# Patient Record
Sex: Female | Born: 2000 | Race: Black or African American | Hispanic: No | Marital: Single | Smoking: Never smoker
Health system: Southern US, Community
[De-identification: ages and names within clinical notes are randomized; demographics above are authoritative.]

## PROBLEM LIST (undated history)

## (undated) DIAGNOSIS — Z9109 Other allergy status, other than to drugs and biological substances: Secondary | ICD-10-CM

## (undated) DIAGNOSIS — J45909 Unspecified asthma, uncomplicated: Secondary | ICD-10-CM

---
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
## 2019-09-11 ENCOUNTER — Encounter: Payer: Self-pay | Admitting: Family Medicine

## 2019-09-11 ENCOUNTER — Other Ambulatory Visit: Payer: Self-pay

## 2019-09-11 ENCOUNTER — Encounter: Payer: Self-pay | Admitting: Obstetrics and Gynecology

## 2019-09-11 ENCOUNTER — Ambulatory Visit (INDEPENDENT_AMBULATORY_CARE_PROVIDER_SITE_OTHER): Payer: Medicaid - Out of State | Admitting: Obstetrics and Gynecology

## 2019-09-11 DIAGNOSIS — Z1389 Encounter for screening for other disorder: Secondary | ICD-10-CM | POA: Diagnosis not present

## 2019-09-11 NOTE — Progress Notes (Signed)
    Post Partum Visit Note  Kristen Irwin is a 19 y.o. No obstetric history on file. female who presents for a postpartum visit. She is 3 weeks postpartum following a Primary ceseran.  I have fully reviewed the prenatal and intrapartum course. The delivery was at 37.0 gestational weeks.  Anesthesia: epidural and spinal. Postpartum course has been unremarkable. Baby is doing well. Baby is feeding by both breast and bottle - Enfamil with Iron. Bleeding no bleeding. Bowel function is normal. Bladder function is normal. Patient is not sexually active. Contraception method is Depo-Provera. Postpartum depression screening: Negative. Patient delivered in [facility]. No records. Baby was breech presentation. Desires to establish care with of office.   The following portions of the patient's history were reviewed and updated as appropriate: allergies, current medications, past family history, past medical history, past social history, past surgical history and problem list.  Review of Systems Pertinent items are noted in HPI.    Objective:  Blood pressure 124/80, pulse 94, weight 92 lb 12.8 oz (42.1 kg).  General:  alert, cooperative and appears stated age  Lungs: clear to auscultation bilaterally  Heart:  regular rate and rhythm, S1, S2 normal, no murmur, click, rub or gallop  Abdomen: soft, non-tender; bowel sounds normal; no masses,  no organomegaly        Assessment:   Normal postpartum exam. Pap smear @ age 21 Depo given in hospital.   Plan:   Essential components of care per ACOG recommendations:  1.  Mood and well being: Patient with negative depression screening today. Reviewed local resources for support.  - Patient does not use tobacco. If using tobacco we discussed reduction and for recently cessation risk of relapse - hx of drug use? No    2. Infant care and feeding:  -Patient currently breastmilk feeding? Yes If breastmilk feeding discussed return to work and pumping. If needed, patient  was provided letter for work to allow for every 2-3 hr pumping breaks, and to be granted a private location to express breastmilk and refrigerated area to store breastmilk. Reviewed importance of draining breast regularly to support lactation. -Social determinants of health (SDOH) reviewed in EPIC. No concerns  3. Sexuality, contraception and birth spacing - Patient does not want a pregnancy in the next year.  Desired family size is 1 children.  - Reviewed forms of contraception in tiered fashion. Patient desired Depo-Provera today.   - Discussed birth spacing of 18 months  4. Sleep and fatigue -Encouraged family/partner/community support of 4 hrs of uninterrupted sleep to help with mood and fatigue  5. Physical Recovery  - Discussed patients delivery and complications - Patient has urinary incontinence? No - Patient is safe to resume physical and sexual activity  6.  Health Maintenance - Last pap smear done NA    7. Chronic Disease- NONE  - PCP follow up    Venia Carbon, NP [facility] for [employer], [facility]

## 2019-11-05 ENCOUNTER — Ambulatory Visit (INDEPENDENT_AMBULATORY_CARE_PROVIDER_SITE_OTHER): Payer: Medicaid Other

## 2019-11-05 ENCOUNTER — Other Ambulatory Visit: Payer: Self-pay

## 2019-11-05 VITALS — BP 102/70 | HR 89 | Ht 61.0 in | Wt 90.0 lb

## 2019-11-05 DIAGNOSIS — Z3042 Encounter for surveillance of injectable contraceptive: Secondary | ICD-10-CM

## 2019-11-05 MED ORDER — MEDROXYPROGESTERONE ACETATE 150 MG/ML IM SUSP
150.0000 mg | Freq: Once | INTRAMUSCULAR | Status: AC
  Administered 2019-11-05: 15:00:00 150 mg via INTRAMUSCULAR

## 2019-11-05 NOTE — Progress Notes (Signed)
Patient was assessed and managed by nursing staff during this encounter. I have reviewed the chart and agree with the documentation and plan. I have also made any necessary editorial changes.  Ion Gonnella, MD 11/05/2019 3:30 PM 

## 2019-11-05 NOTE — Progress Notes (Signed)
Pt here for Depo Provera.  Pt reports that she received her first Depo Provera postpartum on 08/19/19 where she delivered in [facility].  Records under media.  Pt also reports that she has not had intercourse since delivery on 08/19/19 and had vaginal bleeding most of the month of June.  Depo Provera administered per protocol.  Pt tolerated well.   Addison Naegeli, RN 11/05/19

## 2019-11-06 ENCOUNTER — Telehealth: Payer: Self-pay | Admitting: *Deleted

## 2019-11-06 NOTE — Telephone Encounter (Signed)
Received a phone call from Kristen Irwin at [facility] care stating she is calling to return Kristen Irwin's call and let her  Know Kristen Irwin did get depo-provera there on 08/20/19.  Per chart was also verifed by records and depo given.here 11/05/19.  Will route to [place] also. [name],RN

## 2019-11-17 ENCOUNTER — Emergency Department (HOSPITAL_COMMUNITY)
Admission: EM | Admit: 2019-11-17 | Discharge: 2019-11-17 | Disposition: A | Discharge status: Home / Self Care | Payer: Medicaid Other | Attending: Emergency Medicine | Admitting: Emergency Medicine

## 2019-11-17 ENCOUNTER — Other Ambulatory Visit: Payer: Self-pay

## 2019-11-17 ENCOUNTER — Encounter (HOSPITAL_COMMUNITY): Payer: Self-pay | Admitting: *Deleted

## 2019-11-17 DIAGNOSIS — R109 Unspecified abdominal pain: Secondary | ICD-10-CM | POA: Insufficient documentation

## 2019-11-17 DIAGNOSIS — Z5321 Procedure and treatment not carried out due to patient leaving prior to being seen by health care provider: Secondary | ICD-10-CM | POA: Insufficient documentation

## 2019-11-17 LAB — URINALYSIS, ROUTINE W REFLEX MICROSCOPIC
Bilirubin Urine: NEGATIVE
Glucose, UA: NEGATIVE mg/dL
Ketones, ur: NEGATIVE mg/dL
Nitrite: NEGATIVE
Protein, ur: NEGATIVE mg/dL
Specific Gravity, Urine: 1.03 (ref 1.005–1.030)
pH: 6 (ref 5.0–8.0)

## 2019-11-17 LAB — CBC
HCT: 39.6 % (ref 36.0–46.0)
Hemoglobin: 13.3 g/dL (ref 12.0–15.0)
MCH: 30.6 pg (ref 26.0–34.0)
MCHC: 33.6 g/dL (ref 30.0–36.0)
MCV: 91.2 fL (ref 80.0–100.0)
Platelets: 247 10*3/uL (ref 150–400)
RBC: 4.34 MIL/uL (ref 3.87–5.11)
RDW: 12.9 % (ref 11.5–15.5)
WBC: 4.7 10*3/uL (ref 4.0–10.5)
nRBC: 0 % (ref 0.0–0.2)

## 2019-11-17 LAB — COMPREHENSIVE METABOLIC PANEL
ALT: 10 U/L (ref 0–44)
AST: 15 U/L (ref 15–41)
Albumin: 4.1 g/dL (ref 3.5–5.0)
Alkaline Phosphatase: 77 U/L (ref 38–126)
Anion gap: 8 (ref 5–15)
BUN: 11 mg/dL (ref 6–20)
CO2: 23 mmol/L (ref 22–32)
Calcium: 9 mg/dL (ref 8.9–10.3)
Chloride: 108 mmol/L (ref 98–111)
Creatinine, Ser: 0.72 mg/dL (ref 0.44–1.00)
GFR calc Af Amer: >60 mL/min (ref >60)
GFR calc non Af Amer: >60 mL/min (ref >60)
Glucose, Bld: 115 mg/dL — ABNORMAL HIGH (ref 70–99)
Potassium: 3.2 mmol/L — ABNORMAL LOW (ref 3.5–5.1)
Sodium: 139 mmol/L (ref 135–145)
Total Bilirubin: 0.5 mg/dL (ref 0.3–1.2)
Total Protein: 7 g/dL (ref 6.5–8.1)

## 2019-11-17 LAB — LIPASE, BLOOD: Lipase: 44 U/L (ref 11–51)

## 2019-11-17 LAB — I-STAT BETA HCG BLOOD, ED (MC, WL, AP ONLY): I-stat hCG, quantitative: <5 m[IU]/mL (ref <5)

## 2019-11-17 MED ORDER — SODIUM CHLORIDE 0.9% FLUSH: 3.0000 mL | Freq: Once | INTRAVENOUS | Status: DC

## 2019-11-17 NOTE — ED Triage Notes (Signed)
Pt reports being 2 months postpartum and feels like she has a "mass" in her abd that is causing pain. Denies any other symptoms.

## 2019-11-17 NOTE — ED Notes (Signed)
Pt said she can no longer  Pt told the risk of leaving and encouraged to stay

## 2019-11-17 NOTE — ED Notes (Signed)
Pt called for triage x1 no response.

## 2020-01-09 ENCOUNTER — Ambulatory Visit (INDEPENDENT_AMBULATORY_CARE_PROVIDER_SITE_OTHER): Payer: Self-pay | Admitting: Nurse Practitioner

## 2020-01-09 ENCOUNTER — Encounter: Payer: Self-pay | Admitting: Nurse Practitioner

## 2020-01-09 ENCOUNTER — Other Ambulatory Visit: Payer: Self-pay

## 2020-01-09 VITALS — BP 103/64 | HR 89 | Wt 90.6 lb

## 2020-01-09 DIAGNOSIS — R636 Underweight: Secondary | ICD-10-CM

## 2020-01-09 DIAGNOSIS — M6208 Separation of muscle (nontraumatic), other site: Secondary | ICD-10-CM

## 2020-01-09 DIAGNOSIS — Z3042 Encounter for surveillance of injectable contraceptive: Secondary | ICD-10-CM

## 2020-01-09 NOTE — Progress Notes (Signed)
   GYNECOLOGY OFFICE VISIT NOTE   History:  19 y.o. No obstetric history on file. here today for thinking she felt a hernia and is worried she will need surgery again.  Had C/S 4 months ago.  Reports baby is doing well. She denies any abnormal vaginal discharge, bleeding, pelvic pain or other concerns.   Past Medical History:  Diagnosis Date  . Allergy to pollen   . Asthma     No past surgical history on file.  The following portions of the patient's history were reviewed and updated as appropriate: allergies, current medications, past family history, past medical history, past social history, past surgical history and problem list.    Review of Systems:  Pertinent items noted in HPI and remainder of comprehensive ROS otherwise negative.  Objective:  Physical Exam BP 103/64   Pulse 89   Wt 90 lb 9.6 oz (41.1 kg)   BMI 17.12 kg/m  CONSTITUTIONAL: Well-developed, well-nourished female in no acute distress.  HENT:  Normocephalic, atraumatic. External right and left ear normal.  EYES: Conjunctivae and EOM are normal. Pupils are equal, round.  No scleral icterus.  NECK: Normal range of motion, supple SKIN: Skin is warm and dry. No rash noted. Not diaphoretic. No erythema. No pallor. NEUROLOGIC: Alert and oriented to person, place, and time. Normal muscle tone coordination. No cranial nerve deficit noted. PSYCHIATRIC: Normal mood and affect. Normal behavior. Normal judgment and thought content. CARDIOVASCULAR: Normal heart rate noted RESPIRATORY: Effort and breath sounds normal, no problems with respiration noted ABDOMEN: Soft, no distention noted.  Has diastasis from pregnancy 2 FB - nontender.  No umbilical hernia palpated at visit today.  PELVIC: Deferred MUSCULOSKELETAL: Normal range of motion. No edema noted.  Labs and Imaging No results found.  Assessment & Plan:  1.  Abdominal diastiasis Discussed no hernia found today.  Reviewed warning signs for hernia. Advised to  keep appointment as scheduled for follow up for nipple yeast.  2.  Underweight   3.  On Depo Has next appointment scheduled.  Routine preventative health maintenance measures emphasized. Please refer to After Visit Summary for other counseling recommendations.   Return for appointment as previously scheduled.   Total face-to-face time with patient: 10 minutes.  Over 50% of encounter was spent on counseling and coordination of care.  Nolene Bernheim, RN, MSN, NP-BC Nurse Practitioner, [facility] for [employer], [facility] 01/09/2020 10:23 AM

## 2020-01-21 ENCOUNTER — Ambulatory Visit (INDEPENDENT_AMBULATORY_CARE_PROVIDER_SITE_OTHER): Payer: Medicaid Other

## 2020-01-21 ENCOUNTER — Other Ambulatory Visit: Payer: Self-pay

## 2020-01-21 ENCOUNTER — Ambulatory Visit: Payer: Medicaid Other

## 2020-01-21 VITALS — BP 108/73 | HR 99 | Wt 88.6 lb

## 2020-01-21 DIAGNOSIS — Z3042 Encounter for surveillance of injectable contraceptive: Secondary | ICD-10-CM

## 2020-01-21 MED ORDER — MEDROXYPROGESTERONE ACETATE 150 MG/ML IM SUSP
150.0000 mg | Freq: Once | INTRAMUSCULAR | Status: AC
  Administered 2020-01-21: 150 mg via INTRAMUSCULAR

## 2020-01-21 NOTE — Progress Notes (Signed)
Patient was assessed and managed by nursing staff during this encounter. I have reviewed the chart and agree with the documentation and plan. I have also made any necessary editorial changes.  Thressa Sheller DNP, CNM  01/21/20  4:59 PM

## 2020-01-21 NOTE — Progress Notes (Signed)
Kristen Irwin here for Depo-Provera  Injection.  Injection administered without complication. Patient will return in 3 months for next injection.  Pt states she is interested in beginning breastfeeding again at this time. Pt is 5 months postpartum; endorses breastfeeding for first month. Encouraged pt to make an appt with Jasmine December, RN, IBCLC. Report given to [facility].  Marjo Bicker, RN 01/21/2020  3:40 PM

## 2020-04-07 ENCOUNTER — Ambulatory Visit: Payer: Medicaid Other
